# Patient Record
Sex: Female | Born: 1965 | Race: White | Hispanic: No | Marital: Married | Smoking: Never smoker
Health system: Southern US, Community
[De-identification: ages and names within clinical notes are randomized; demographics above are authoritative.]

## PROBLEM LIST (undated history)

## (undated) DIAGNOSIS — K589 Irritable bowel syndrome without diarrhea: Secondary | ICD-10-CM

## (undated) DIAGNOSIS — F329 Major depressive disorder, single episode, unspecified: Secondary | ICD-10-CM

## (undated) DIAGNOSIS — D649 Anemia, unspecified: Secondary | ICD-10-CM

## (undated) DIAGNOSIS — F32A Depression, unspecified: Secondary | ICD-10-CM

## (undated) DIAGNOSIS — Z8719 Personal history of other diseases of the digestive system: Secondary | ICD-10-CM

## (undated) DIAGNOSIS — E785 Hyperlipidemia, unspecified: Secondary | ICD-10-CM

## (undated) DIAGNOSIS — F419 Anxiety disorder, unspecified: Secondary | ICD-10-CM

## (undated) DIAGNOSIS — K219 Gastro-esophageal reflux disease without esophagitis: Secondary | ICD-10-CM

## (undated) DIAGNOSIS — M1990 Unspecified osteoarthritis, unspecified site: Secondary | ICD-10-CM

## (undated) HISTORY — DX: Major depressive disorder, single episode, unspecified: F32.9

## (undated) HISTORY — DX: Anemia, unspecified: D64.9

## (undated) HISTORY — PX: ABDOMINAL HYSTERECTOMY: SHX81

## (undated) HISTORY — PX: CHOLECYSTECTOMY: SHX55

## (undated) HISTORY — DX: Hyperlipidemia, unspecified: E78.5

## (undated) HISTORY — DX: Unspecified osteoarthritis, unspecified site: M19.90

## (undated) HISTORY — DX: Gastro-esophageal reflux disease without esophagitis: K21.9

## (undated) HISTORY — PX: OTHER SURGICAL HISTORY: SHX169

## (undated) HISTORY — DX: Personal history of other diseases of the digestive system: Z87.19

## (undated) HISTORY — DX: Anxiety disorder, unspecified: F41.9

## (undated) HISTORY — DX: Depression, unspecified: F32.A

## (undated) HISTORY — DX: Irritable bowel syndrome, unspecified: K58.9

## (undated) HISTORY — PX: TUBAL LIGATION: SHX77

---

## 1998-08-28 ENCOUNTER — Other Ambulatory Visit: Admission: RE | Admit: 1998-08-28 | Discharge: 1998-08-28 | Payer: Self-pay | Admitting: *Deleted

## 1999-09-03 ENCOUNTER — Other Ambulatory Visit: Admission: RE | Admit: 1999-09-03 | Discharge: 1999-09-03 | Payer: Self-pay | Admitting: *Deleted

## 2000-08-25 ENCOUNTER — Other Ambulatory Visit: Admission: RE | Admit: 2000-08-25 | Discharge: 2000-08-25 | Payer: Self-pay | Admitting: *Deleted

## 2001-10-06 ENCOUNTER — Other Ambulatory Visit: Admission: RE | Admit: 2001-10-06 | Discharge: 2001-10-06 | Payer: Self-pay | Admitting: *Deleted

## 2002-10-26 ENCOUNTER — Other Ambulatory Visit: Admission: RE | Admit: 2002-10-26 | Discharge: 2002-10-26 | Payer: Self-pay | Admitting: *Deleted

## 2003-01-03 ENCOUNTER — Encounter: Admission: RE | Admit: 2003-01-03 | Discharge: 2003-01-03 | Payer: Self-pay | Admitting: Family Medicine

## 2003-01-03 ENCOUNTER — Encounter: Payer: Self-pay | Admitting: Family Medicine

## 2003-12-02 ENCOUNTER — Observation Stay (HOSPITAL_COMMUNITY): Admission: RE | Admit: 2003-12-02 | Discharge: 2003-12-04 | Payer: Self-pay | Admitting: *Deleted

## 2005-02-21 ENCOUNTER — Ambulatory Visit: Payer: Self-pay | Admitting: Family Medicine

## 2006-01-27 ENCOUNTER — Ambulatory Visit: Payer: Self-pay | Admitting: Family Medicine

## 2014-12-16 HISTORY — PX: GALLBLADDER SURGERY: SHX652

## 2015-06-12 HISTORY — PX: ESOPHAGOGASTRODUODENOSCOPY: SHX1529

## 2017-03-14 DIAGNOSIS — D447 Neoplasm of uncertain behavior of aortic body and other paraganglia: Secondary | ICD-10-CM | POA: Diagnosis not present

## 2017-03-14 DIAGNOSIS — H9201 Otalgia, right ear: Secondary | ICD-10-CM | POA: Diagnosis not present

## 2017-03-27 DIAGNOSIS — D447 Neoplasm of uncertain behavior of aortic body and other paraganglia: Secondary | ICD-10-CM | POA: Diagnosis not present

## 2017-03-27 DIAGNOSIS — D4989 Neoplasm of unspecified behavior of other specified sites: Secondary | ICD-10-CM | POA: Diagnosis not present

## 2017-03-27 DIAGNOSIS — H938X1 Other specified disorders of right ear: Secondary | ICD-10-CM | POA: Diagnosis not present

## 2017-04-09 DIAGNOSIS — F3289 Other specified depressive episodes: Secondary | ICD-10-CM | POA: Diagnosis not present

## 2017-04-16 DIAGNOSIS — M79662 Pain in left lower leg: Secondary | ICD-10-CM | POA: Diagnosis not present

## 2017-04-16 DIAGNOSIS — R2242 Localized swelling, mass and lump, left lower limb: Secondary | ICD-10-CM | POA: Diagnosis not present

## 2017-05-06 DIAGNOSIS — H903 Sensorineural hearing loss, bilateral: Secondary | ICD-10-CM | POA: Diagnosis not present

## 2017-05-06 DIAGNOSIS — H9201 Otalgia, right ear: Secondary | ICD-10-CM | POA: Diagnosis not present

## 2017-05-06 DIAGNOSIS — E785 Hyperlipidemia, unspecified: Secondary | ICD-10-CM | POA: Diagnosis not present

## 2017-05-06 DIAGNOSIS — Z880 Allergy status to penicillin: Secondary | ICD-10-CM | POA: Diagnosis not present

## 2017-05-06 DIAGNOSIS — D447 Neoplasm of uncertain behavior of aortic body and other paraganglia: Secondary | ICD-10-CM | POA: Diagnosis not present

## 2017-05-06 DIAGNOSIS — H9311 Tinnitus, right ear: Secondary | ICD-10-CM | POA: Diagnosis not present

## 2017-05-06 DIAGNOSIS — Z882 Allergy status to sulfonamides status: Secondary | ICD-10-CM | POA: Diagnosis not present

## 2017-05-21 DIAGNOSIS — F3289 Other specified depressive episodes: Secondary | ICD-10-CM | POA: Diagnosis not present

## 2017-05-23 DIAGNOSIS — Z1231 Encounter for screening mammogram for malignant neoplasm of breast: Secondary | ICD-10-CM | POA: Diagnosis not present

## 2017-06-25 DIAGNOSIS — F3289 Other specified depressive episodes: Secondary | ICD-10-CM | POA: Diagnosis not present

## 2017-07-08 DIAGNOSIS — L720 Epidermal cyst: Secondary | ICD-10-CM | POA: Diagnosis not present

## 2017-07-08 DIAGNOSIS — D225 Melanocytic nevi of trunk: Secondary | ICD-10-CM | POA: Diagnosis not present

## 2017-07-08 DIAGNOSIS — D485 Neoplasm of uncertain behavior of skin: Secondary | ICD-10-CM | POA: Diagnosis not present

## 2017-07-16 HISTORY — PX: INNER EAR SURGERY: SHX679

## 2017-07-23 DIAGNOSIS — Z882 Allergy status to sulfonamides status: Secondary | ICD-10-CM | POA: Diagnosis not present

## 2017-07-23 DIAGNOSIS — H903 Sensorineural hearing loss, bilateral: Secondary | ICD-10-CM | POA: Diagnosis not present

## 2017-07-23 DIAGNOSIS — G4700 Insomnia, unspecified: Secondary | ICD-10-CM | POA: Diagnosis not present

## 2017-07-23 DIAGNOSIS — H93A1 Pulsatile tinnitus, right ear: Secondary | ICD-10-CM | POA: Diagnosis not present

## 2017-07-23 DIAGNOSIS — D385 Neoplasm of uncertain behavior of other respiratory organs: Secondary | ICD-10-CM | POA: Diagnosis not present

## 2017-07-23 DIAGNOSIS — M1990 Unspecified osteoarthritis, unspecified site: Secondary | ICD-10-CM | POA: Diagnosis not present

## 2017-07-23 DIAGNOSIS — D447 Neoplasm of uncertain behavior of aortic body and other paraganglia: Secondary | ICD-10-CM | POA: Diagnosis not present

## 2017-07-23 DIAGNOSIS — Z79899 Other long term (current) drug therapy: Secondary | ICD-10-CM | POA: Diagnosis not present

## 2017-07-23 DIAGNOSIS — D1809 Hemangioma of other sites: Secondary | ICD-10-CM | POA: Diagnosis not present

## 2017-07-23 DIAGNOSIS — E785 Hyperlipidemia, unspecified: Secondary | ICD-10-CM | POA: Diagnosis not present

## 2017-07-23 DIAGNOSIS — Z880 Allergy status to penicillin: Secondary | ICD-10-CM | POA: Diagnosis not present

## 2017-07-23 DIAGNOSIS — Z7982 Long term (current) use of aspirin: Secondary | ICD-10-CM | POA: Diagnosis not present

## 2017-07-23 DIAGNOSIS — Z9189 Other specified personal risk factors, not elsewhere classified: Secondary | ICD-10-CM | POA: Diagnosis not present

## 2017-07-23 DIAGNOSIS — H61391 Other acquired stenosis of right external ear canal: Secondary | ICD-10-CM | POA: Diagnosis not present

## 2017-07-23 DIAGNOSIS — H61301 Acquired stenosis of right external ear canal, unspecified: Secondary | ICD-10-CM | POA: Diagnosis not present

## 2017-07-23 DIAGNOSIS — E559 Vitamin D deficiency, unspecified: Secondary | ICD-10-CM | POA: Diagnosis not present

## 2017-08-03 DIAGNOSIS — H5704 Mydriasis: Secondary | ICD-10-CM | POA: Diagnosis not present

## 2017-08-03 DIAGNOSIS — R531 Weakness: Secondary | ICD-10-CM | POA: Diagnosis not present

## 2017-08-03 DIAGNOSIS — S0990XA Unspecified injury of head, initial encounter: Secondary | ICD-10-CM | POA: Diagnosis not present

## 2017-08-03 DIAGNOSIS — G902 Horner's syndrome: Secondary | ICD-10-CM | POA: Diagnosis not present

## 2017-09-23 DIAGNOSIS — R6889 Other general symptoms and signs: Secondary | ICD-10-CM | POA: Diagnosis not present

## 2017-09-23 DIAGNOSIS — R509 Fever, unspecified: Secondary | ICD-10-CM | POA: Diagnosis not present

## 2017-10-21 DIAGNOSIS — Z23 Encounter for immunization: Secondary | ICD-10-CM | POA: Diagnosis not present

## 2017-10-28 DIAGNOSIS — Z9889 Other specified postprocedural states: Secondary | ICD-10-CM | POA: Diagnosis not present

## 2017-10-28 DIAGNOSIS — D1809 Hemangioma of other sites: Secondary | ICD-10-CM | POA: Diagnosis not present

## 2017-10-28 DIAGNOSIS — H903 Sensorineural hearing loss, bilateral: Secondary | ICD-10-CM | POA: Diagnosis not present

## 2018-02-04 DIAGNOSIS — H43812 Vitreous degeneration, left eye: Secondary | ICD-10-CM | POA: Diagnosis not present

## 2018-03-23 DIAGNOSIS — B029 Zoster without complications: Secondary | ICD-10-CM | POA: Diagnosis not present

## 2018-04-10 DIAGNOSIS — R7303 Prediabetes: Secondary | ICD-10-CM | POA: Diagnosis not present

## 2018-04-10 DIAGNOSIS — E782 Mixed hyperlipidemia: Secondary | ICD-10-CM | POA: Diagnosis not present

## 2018-04-10 DIAGNOSIS — F419 Anxiety disorder, unspecified: Secondary | ICD-10-CM | POA: Diagnosis not present

## 2018-04-10 DIAGNOSIS — G4700 Insomnia, unspecified: Secondary | ICD-10-CM | POA: Diagnosis not present

## 2018-05-01 ENCOUNTER — Encounter: Payer: Self-pay | Admitting: Gastroenterology

## 2018-05-18 DIAGNOSIS — J029 Acute pharyngitis, unspecified: Secondary | ICD-10-CM | POA: Diagnosis not present

## 2018-06-17 DIAGNOSIS — Z1231 Encounter for screening mammogram for malignant neoplasm of breast: Secondary | ICD-10-CM | POA: Diagnosis not present

## 2018-06-26 DIAGNOSIS — S6991XA Unspecified injury of right wrist, hand and finger(s), initial encounter: Secondary | ICD-10-CM | POA: Diagnosis not present

## 2018-07-03 DIAGNOSIS — S6991XA Unspecified injury of right wrist, hand and finger(s), initial encounter: Secondary | ICD-10-CM | POA: Diagnosis not present

## 2018-07-06 DIAGNOSIS — S6991XA Unspecified injury of right wrist, hand and finger(s), initial encounter: Secondary | ICD-10-CM | POA: Diagnosis not present

## 2018-07-06 DIAGNOSIS — S62344A Nondisplaced fracture of base of fourth metacarpal bone, right hand, initial encounter for closed fracture: Secondary | ICD-10-CM | POA: Diagnosis not present

## 2018-07-08 DIAGNOSIS — D485 Neoplasm of uncertain behavior of skin: Secondary | ICD-10-CM | POA: Diagnosis not present

## 2018-07-08 DIAGNOSIS — D235 Other benign neoplasm of skin of trunk: Secondary | ICD-10-CM | POA: Diagnosis not present

## 2018-07-08 DIAGNOSIS — D225 Melanocytic nevi of trunk: Secondary | ICD-10-CM | POA: Diagnosis not present

## 2018-07-08 DIAGNOSIS — D1801 Hemangioma of skin and subcutaneous tissue: Secondary | ICD-10-CM | POA: Diagnosis not present

## 2018-07-13 DIAGNOSIS — S62344A Nondisplaced fracture of base of fourth metacarpal bone, right hand, initial encounter for closed fracture: Secondary | ICD-10-CM | POA: Diagnosis not present

## 2018-08-10 DIAGNOSIS — S62344A Nondisplaced fracture of base of fourth metacarpal bone, right hand, initial encounter for closed fracture: Secondary | ICD-10-CM | POA: Diagnosis not present

## 2018-09-07 DIAGNOSIS — S62344A Nondisplaced fracture of base of fourth metacarpal bone, right hand, initial encounter for closed fracture: Secondary | ICD-10-CM | POA: Diagnosis not present

## 2018-10-02 DIAGNOSIS — Z23 Encounter for immunization: Secondary | ICD-10-CM | POA: Diagnosis not present

## 2018-10-20 DIAGNOSIS — G43909 Migraine, unspecified, not intractable, without status migrainosus: Secondary | ICD-10-CM | POA: Diagnosis not present

## 2018-10-20 DIAGNOSIS — G4700 Insomnia, unspecified: Secondary | ICD-10-CM | POA: Diagnosis not present

## 2018-10-20 DIAGNOSIS — K449 Diaphragmatic hernia without obstruction or gangrene: Secondary | ICD-10-CM | POA: Diagnosis not present

## 2018-10-27 DIAGNOSIS — Z79899 Other long term (current) drug therapy: Secondary | ICD-10-CM | POA: Diagnosis not present

## 2018-10-27 DIAGNOSIS — Z0000 Encounter for general adult medical examination without abnormal findings: Secondary | ICD-10-CM | POA: Diagnosis not present

## 2018-10-29 DIAGNOSIS — Z9889 Other specified postprocedural states: Secondary | ICD-10-CM | POA: Diagnosis not present

## 2018-10-29 DIAGNOSIS — H903 Sensorineural hearing loss, bilateral: Secondary | ICD-10-CM | POA: Diagnosis not present

## 2018-10-29 DIAGNOSIS — H9311 Tinnitus, right ear: Secondary | ICD-10-CM | POA: Diagnosis not present

## 2018-10-30 DIAGNOSIS — Z6827 Body mass index (BMI) 27.0-27.9, adult: Secondary | ICD-10-CM | POA: Diagnosis not present

## 2018-10-30 DIAGNOSIS — Z0000 Encounter for general adult medical examination without abnormal findings: Secondary | ICD-10-CM | POA: Diagnosis not present

## 2018-10-30 DIAGNOSIS — Z1231 Encounter for screening mammogram for malignant neoplasm of breast: Secondary | ICD-10-CM | POA: Diagnosis not present

## 2018-10-30 DIAGNOSIS — Z1331 Encounter for screening for depression: Secondary | ICD-10-CM | POA: Diagnosis not present

## 2019-01-12 ENCOUNTER — Encounter: Payer: Self-pay | Admitting: Gastroenterology

## 2019-01-13 ENCOUNTER — Ambulatory Visit (INDEPENDENT_AMBULATORY_CARE_PROVIDER_SITE_OTHER): Payer: BLUE CROSS/BLUE SHIELD | Admitting: Gastroenterology

## 2019-01-13 ENCOUNTER — Encounter: Payer: Self-pay | Admitting: Gastroenterology

## 2019-01-13 VITALS — BP 110/76 | HR 87 | Ht 64.0 in | Wt 170.1 lb

## 2019-01-13 DIAGNOSIS — K2100 Gastro-esophageal reflux disease with esophagitis, without bleeding: Secondary | ICD-10-CM

## 2019-01-13 DIAGNOSIS — Z1211 Encounter for screening for malignant neoplasm of colon: Secondary | ICD-10-CM

## 2019-01-13 DIAGNOSIS — Z1212 Encounter for screening for malignant neoplasm of rectum: Secondary | ICD-10-CM | POA: Diagnosis not present

## 2019-01-13 MED ORDER — PANTOPRAZOLE SODIUM 20 MG PO TBEC: 20.0000 mg | DELAYED_RELEASE_TABLET | Freq: Every day | ORAL | 11 refills | Status: DC

## 2019-01-13 MED ORDER — SOD PICOSULFATE-MAG OX-CIT ACD 10-3.5-12 MG-GM -GM/160ML PO SOLN: 1.0000 | Freq: Once | ORAL | 0 refills | Status: AC

## 2019-01-13 MED ORDER — LORAZEPAM 0.5 MG PO TABS: ORAL_TABLET | ORAL | 0 refills | Status: AC

## 2019-01-13 NOTE — Progress Notes (Signed)
Chief Complaint: GERD/colorectal cancer screening.  Referring Provider:  Ernestene Kiel, MD      ASSESSMENT AND PLAN;   #1. GERD with erosive esophagitis, small HH #2. Colorectal cancer screening #3. Family history of esophageal adenocarcinoma (dad)  Plan: -Protonix 20mg  po qd. -Proceed with EGD and colonoscopy.  I have discussed the risks & benefits.  The risks including risk of perforation requiring laparotomy, bleeding after biopsies/polypectomy requiring blood transfusion and risks of anesthesia/sedation were discussed.  Rare risks of missing UGI and colorectal neoplasms were also discussed.  Consent forms given for review. -Ativan 0.5mg  po 2 hr before with sip of water. (2 tabs given) as she has problems with IV (has anxiety and vasovagal syncope)    HPI:    Phyllis Rojas is a 53 y.o. female  Having problems with heartburn, regurgitation.  Occasional dysphagia but not significant.  Has been taking Tums.  Has been on omeprazole previously but stopped since she had "brittle nails" Denies having any melena or hematochezia. Referred for screening colonoscopy.  Would like to get EGD performed as well due to longstanding gastroesophageal reflux. Alt diarrhea and constipation - since GB Sx-has more intermittent diarrhea. Attributed to IBS in the past. No weight loss No family history of colon cancer or colonic polyps.   Past Medical History:  Diagnosis Date  . Anxiety   . Dyslipidemia   . GERD (gastroesophageal reflux disease)   . Hx of pancreatitis   . IBS (irritable bowel syndrome)     Past Surgical History:  Procedure Laterality Date  . ABDOMINAL HYSTERECTOMY    . CESAREAN SECTION    . ESOPHAGOGASTRODUODENOSCOPY  06/12/2015   Irregular Z line suggestive GERD. Minimal hiatal hernia.   [facility] GALLBLADDER SURGERY  2016  . INNER EAR SURGERY  07/2017  . left knee surgery    . TUBAL LIGATION      Family History  Problem Relation Age of Onset  . Esophageal cancer  Father   . Stomach cancer Father   . Colon cancer Neg Hx   . Breast cancer Neg Hx     Social History   Tobacco Use  . Smoking status: Never Smoker  . Smokeless tobacco: Never Used  Substance Use Topics  . Alcohol use: Yes  . Drug use: Not Currently    Types: Marijuana    Current Outpatient Medications  Medication Sig Dispense Refill  . Multiple Vitamins-Minerals (EMERGEN-C IMMUNE PO) Take by mouth daily. 1 packet a day    . Omega-3 Fatty Acids (FISH OIL OMEGA-3 PO) Take 1 tablet by mouth daily.    . Red Yeast Rice Extract (RED YEAST RICE PO) Take 1 tablet by mouth daily.    . traZODone (DESYREL) 50 MG tablet 1 tablet daily.     No current facility-administered medications for this visit.     Allergies  Allergen Reactions  . Sulfa Antibiotics   . Penicillin G Rash  . Sulfamethoxazole Rash    Review of Systems:  Constitutional: Denies fever, chills, diaphoresis, appetite change and fatigue.  HEENT: Denies photophobia, eye pain, redness, hearing loss, ear pain, congestion, sore throat, rhinorrhea, sneezing, mouth sores, neck pain, neck stiffness and tinnitus.   Respiratory: Denies SOB, DOE, cough, chest tightness,  and wheezing.   Cardiovascular: Denies chest pain, palpitations and leg swelling.  Genitourinary: Denies dysuria, urgency, frequency, hematuria, flank pain and difficulty urinating.  Musculoskeletal: Denies myalgias, back pain, joint swelling, arthralgias and gait problem.  Skin: No rash.  Neurological: Denies  dizziness, seizures, syncope, weakness, light-headedness, numbness and headaches.  Hematological: Denies adenopathy. Easy bruising, personal or family bleeding history  Psychiatric/Behavioral: Has anxiety or depression     Physical Exam:    BP 110/76   Pulse 87   Ht 5\' 4"  (1.626 m)   Wt 170 lb 2 oz (77.2 kg)   BMI 29.20 kg/m  Filed Weights   01/13/19 0904  Weight: 170 lb 2 oz (77.2 kg)   Constitutional:  Well-developed, in no acute  distress. Psychiatric: Normal mood and affect. Behavior is normal. HEENT: Pupils normal.  Conjunctivae are normal. No scleral icterus.  Cardiovascular: Normal rate, regular rhythm. No edema Pulmonary/chest: Effort normal and breath sounds normal. No wheezing, rales or rhonchi. Abdominal: Soft, nondistended. Nontender. Bowel sounds active throughout. There are no masses palpable. No hepatomegaly. Rectal:  defered Neurological: Alert and oriented to person place and time. Skin: Skin is warm and dry. No rashes noted.    Carmell Austria, MD 01/13/2019, 9:15 AM  Cc: Ernestene Kiel, MD

## 2019-01-13 NOTE — Patient Instructions (Addendum)
If you are age 52 or older, your body mass index should be between 23-30. Your Body mass index is 29.2 kg/m. If this is out of the aforementioned range listed, please consider follow up with your Primary Care Provider.  If you are age 52 or younger, your body mass index should be between 19-25. Your Body mass index is 29.2 kg/m. If this is out of the aformentioned range listed, please consider follow up with your Primary Care Provider.   We have sent the following medications to your pharmacy for you to pick up at your convenience: [place] have been scheduled for an endoscopy and colonoscopy. Please follow the written instructions given to you at your visit today. Please pick up your prep supplies at the pharmacy within the next 1-3 days. If you use inhalers (even only as needed), please bring them with you on the day of your procedure. Your physician has requested that you go to www.startemmi.com and enter the access code given to you at your visit today. This web site gives a general overview about your procedure. However, you should still follow specific instructions given to you by our office regarding your preparation for the procedure.   It has been recommended to you by your physician that you have a(n) EGD/Colonoscopy completed. Per your request, we did not schedule the procedure(s) today. Please contact our office at [phone]  to have the procedure completed.   Thank you,  Dr. Jackquline Denmark

## 2019-01-20 ENCOUNTER — Encounter: Payer: Self-pay | Admitting: Gastroenterology

## 2019-01-29 DIAGNOSIS — R748 Abnormal levels of other serum enzymes: Secondary | ICD-10-CM | POA: Diagnosis not present

## 2019-01-29 DIAGNOSIS — E782 Mixed hyperlipidemia: Secondary | ICD-10-CM | POA: Diagnosis not present

## 2019-02-12 ENCOUNTER — Ambulatory Visit (AMBULATORY_SURGERY_CENTER): Payer: Self-pay

## 2019-02-12 ENCOUNTER — Encounter: Payer: Self-pay | Admitting: Gastroenterology

## 2019-02-12 VITALS — Ht 64.0 in | Wt 168.2 lb

## 2019-02-12 DIAGNOSIS — K2100 Gastro-esophageal reflux disease with esophagitis, without bleeding: Secondary | ICD-10-CM

## 2019-02-12 DIAGNOSIS — Z1211 Encounter for screening for malignant neoplasm of colon: Secondary | ICD-10-CM

## 2019-02-12 MED ORDER — NA SULFATE-K SULFATE-MG SULF 17.5-3.13-1.6 GM/177ML PO SOLN: 1.0000 | Freq: Once | ORAL | 0 refills | Status: AC

## 2019-02-12 NOTE — Progress Notes (Signed)
Denies allergies to eggs or soy products. Denies complication of anesthesia or sedation. Denies use of weight loss medication. Denies use of O2.   Emmi instructions declined.   A pay no more than 50.00 coupon for Suprep was given to the patient.  

## 2019-02-26 ENCOUNTER — Other Ambulatory Visit: Payer: Self-pay

## 2019-02-26 ENCOUNTER — Ambulatory Visit (AMBULATORY_SURGERY_CENTER): Payer: BLUE CROSS/BLUE SHIELD | Admitting: Gastroenterology

## 2019-02-26 ENCOUNTER — Encounter: Payer: Self-pay | Admitting: Gastroenterology

## 2019-02-26 VITALS — BP 131/77 | HR 59 | Temp 99.3°F | Resp 11 | Ht 64.0 in | Wt 170.0 lb

## 2019-02-26 DIAGNOSIS — K589 Irritable bowel syndrome without diarrhea: Secondary | ICD-10-CM

## 2019-02-26 DIAGNOSIS — D127 Benign neoplasm of rectosigmoid junction: Secondary | ICD-10-CM | POA: Diagnosis not present

## 2019-02-26 DIAGNOSIS — K317 Polyp of stomach and duodenum: Secondary | ICD-10-CM | POA: Diagnosis not present

## 2019-02-26 DIAGNOSIS — D128 Benign neoplasm of rectum: Secondary | ICD-10-CM

## 2019-02-26 DIAGNOSIS — K2100 Gastro-esophageal reflux disease with esophagitis, without bleeding: Secondary | ICD-10-CM

## 2019-02-26 DIAGNOSIS — K621 Rectal polyp: Secondary | ICD-10-CM | POA: Diagnosis not present

## 2019-02-26 DIAGNOSIS — D120 Benign neoplasm of cecum: Secondary | ICD-10-CM | POA: Diagnosis not present

## 2019-02-26 DIAGNOSIS — K449 Diaphragmatic hernia without obstruction or gangrene: Secondary | ICD-10-CM

## 2019-02-26 DIAGNOSIS — K2970 Gastritis, unspecified, without bleeding: Secondary | ICD-10-CM | POA: Diagnosis not present

## 2019-02-26 DIAGNOSIS — K229 Disease of esophagus, unspecified: Secondary | ICD-10-CM

## 2019-02-26 DIAGNOSIS — K208 Other esophagitis: Secondary | ICD-10-CM | POA: Diagnosis not present

## 2019-02-26 DIAGNOSIS — K219 Gastro-esophageal reflux disease without esophagitis: Secondary | ICD-10-CM | POA: Diagnosis not present

## 2019-02-26 DIAGNOSIS — Z1211 Encounter for screening for malignant neoplasm of colon: Secondary | ICD-10-CM

## 2019-02-26 DIAGNOSIS — D125 Benign neoplasm of sigmoid colon: Secondary | ICD-10-CM

## 2019-02-26 MED ORDER — SODIUM CHLORIDE 0.9 % IV SOLN: 500.0000 mL | Freq: Once | INTRAVENOUS | Status: DC

## 2019-02-26 NOTE — Op Note (Signed)
[name] Procedure Date: 02/26/2019 8:06 AM MRN: [id number] Endoscopist: Jackquline Denmark , MD Age: 53 Referring MD:  Date of Birth: 1966-02-11 Gender: Female Account #: [id number] Procedure:                Colonoscopy Indications:              Screening for colorectal malignant neoplasm,                            intermittent diarrhea attributed to IBS. Medicines:                Monitored Anesthesia Care Procedure:                Pre-Anesthesia Assessment:                           - Prior to the procedure, a History and Physical                            was performed, and patient medications and                            allergies were reviewed. The patient's tolerance of                            previous anesthesia was also reviewed. The risks                            and benefits of the procedure and the sedation                            options and risks were discussed with the patient.                            All questions were answered, and informed consent                            was obtained. Prior Anticoagulants: The patient has                            taken no previous anticoagulant or antiplatelet                            agents. ASA Grade Assessment: II - A patient with                            mild systemic disease. After reviewing the risks                            and benefits, the patient was deemed in                            satisfactory condition to undergo the procedure.  After obtaining informed consent, the colonoscope                            was passed under direct vision. Throughout the                            procedure, the patient's blood pressure, pulse, and                            oxygen saturations were monitored continuously. The                            Colonoscope was introduced through the anus and                            advanced to the 2 cm into  the ileum. The                            colonoscopy was performed without difficulty. The                            patient tolerated the procedure well. The quality                            of the bowel preparation was excellent. The                            terminal ileum, ileocecal valve, appendiceal                            orifice, and rectum were photographed. Scope In: 8:22:08 AM Scope Out: 8:34:39 AM Scope Withdrawal Time: 0 hours 9 minutes 13 seconds  Total Procedure Duration: 0 hours 12 minutes 31 seconds  Findings:                 A 8 mm polyp was found in the cecum. The polyp was                            sessile. The polyp was removed with a cold snare.                            Resection and retrieval were complete. Estimated                            blood loss: none.                           Two sessile polyps were found in the rectum and                            distal sigmoid colon. The polyps were 2 to 4 mm in                            size.  These polyps were removed with a cold snare.                            Resection and retrieval were complete. Estimated                            blood loss: none.                           The terminal ileum appeared normal.                           Biopsies for histology were taken with a cold                            forceps from the entire colon for evaluation of                            microscopic colitis. Estimated blood loss: none.                           The exam was otherwise without abnormality on                            direct and retroflexion views. Complications:            No immediate complications. Estimated Blood Loss:     Estimated blood loss: none. Impression:               -Colonic polyps status post polypectomy.                           -Otherwise normal colonoscopy to TI. Recommendation:           - Patient has a contact number available for                             emergencies. The signs and symptoms of potential                            delayed complications were discussed with the                            patient. Return to normal activities tomorrow.                            Written discharge instructions were provided to the                            patient.                           - Resume previous diet.                           - Continue present medications.                           -  Await pathology results.                           - Repeat colonoscopy for surveillance based on                            pathology results.                           - Return to GI clinic PRN. Jackquline Denmark, MD 02/26/2019 8:45:40 AM This report has been signed electronically.

## 2019-02-26 NOTE — Patient Instructions (Signed)
No aspirin, adivil, ibuprofen, and other NSAIDS for 5 days- can start again next Wednesday if needed  YOU HAD AN ENDOSCOPIC PROCEDURE TODAY AT [facility]:   Refer to the procedure report that was given to you for any specific questions about what was found during the examination.  If the procedure report does not answer your questions, please call your gastroenterologist to clarify.  If you requested that your care partner not be given the details of your procedure findings, then the procedure report has been included in a sealed envelope for you to review at your convenience later.  YOU SHOULD EXPECT: Some feelings of bloating in the abdomen. Passage of more gas than usual.  Walking can help get rid of the air that was put into your GI tract during the procedure and reduce the bloating. If you had a lower endoscopy (such as a colonoscopy or flexible sigmoidoscopy) you may notice spotting of blood in your stool or on the toilet paper. If you underwent a bowel prep for your procedure, you may not have a normal bowel movement for a few days.  Please Note:  You might notice some irritation and congestion in your nose or some drainage.  This is from the oxygen used during your procedure.  There is no need for concern and it should clear up in a day or so.  SYMPTOMS TO REPORT IMMEDIATELY:   Following lower endoscopy (colonoscopy or flexible sigmoidoscopy):  Excessive amounts of blood in the stool  Significant tenderness or worsening of abdominal pains  Swelling of the abdomen that is new, acute  Fever of 100F or higher   Following upper endoscopy (EGD)  Vomiting of blood or coffee ground material  New chest pain or pain under the shoulder blades  Painful or persistently difficult swallowing  New shortness of breath  Fever of 100F or higher  Black, tarry-looking stools  For urgent or emergent issues, a gastroenterologist can be reached at any hour by calling [phone]  [phone].   DIET:  We do recommend a small meal at first, but then you may proceed to your regular diet.  Drink plenty of fluids but you should avoid alcoholic beverages for 24 hours.  ACTIVITY:  You should plan to take it easy for the rest of today and you should NOT DRIVE or use heavy machinery until tomorrow (because of the sedation medicines used during the test).    FOLLOW UP: Our staff will call the number listed on your records the next business day following your procedure to check on you and address any questions or concerns that you may have regarding the information given to you following your procedure. If we do not reach you, we will leave a message.  However, if you are feeling well and you are not experiencing any problems, there is no need to return our call.  We will assume that you have returned to your regular daily activities without incident.  If any biopsies were taken you will be contacted by phone or by letter within the next 1-3 weeks.  Please call us at [phone] if you have not heard about the biopsies in 3 weeks.    SIGNATURES/CONFIDENTIALITY: You and/or your care partner have signed paperwork which will be entered into your electronic medical record.  These signatures attest to the fact that that the information above on your After Visit Summary has been reviewed and is understood.  Full responsibility of the confidentiality of this discharge information  lies with you and/or your care-partner.

## 2019-02-26 NOTE — Progress Notes (Signed)
Report given to PACU, vss 

## 2019-02-26 NOTE — Progress Notes (Signed)
Called to room to assist during endoscopic procedure.  Patient ID and intended procedure confirmed with present staff. Received instructions for my participation in the procedure from the performing physician.  

## 2019-02-26 NOTE — Progress Notes (Signed)
Pt's states no medical or surgical changes since previsit or office visit. 

## 2019-02-26 NOTE — Op Note (Signed)
[facility] Patient Name: Phyllis Rojas Procedure Date: 02/26/2019 8:06 AM MRN: [id number] Endoscopist: Jackquline Denmark , MD Age: 53 Referring MD:  Date of Birth: December 09, 1966 Gender: Female Account #: [id number] Procedure:                Upper GI endoscopy Indications:              Epigastric abdominal pain, family history of                            esophageal cancer. Medicines:                Monitored Anesthesia Care Procedure:                Pre-Anesthesia Assessment:                           - Prior to the procedure, a History and Physical                            was performed, and patient medications and                            allergies were reviewed. The patient's tolerance of                            previous anesthesia was also reviewed. The risks                            and benefits of the procedure and the sedation                            options and risks were discussed with the patient.                            All questions were answered, and informed consent                            was obtained. Prior Anticoagulants: The patient has                            taken no previous anticoagulant or antiplatelet                            agents. ASA Grade Assessment: II - A patient with                            mild systemic disease. After reviewing the risks                            and benefits, the patient was deemed in                            satisfactory condition to undergo the procedure.  After obtaining informed consent, the endoscope was                            passed under direct vision. Throughout the                            procedure, the patient's blood pressure, pulse, and                            oxygen saturations were monitored continuously. The                            Endoscope was introduced through the mouth, and                            advanced to the second part of duodenum. The  upper                            GI endoscopy was accomplished without difficulty.                            The patient tolerated the procedure well. Scope In: Scope Out: Findings:                 The Z-line was irregular and was found 35 cm from                            the incisors. Biopsies were taken with a cold                            forceps for histology, directed by narrowband                            imaging. Estimated blood loss: none.                           A small transient hiatal hernia was present.                           A single 8 mm semi-sessile polyp with no bleeding                            and no stigmata of recent bleeding was found in the                            gastric fundus. The polyp was removed with a hot                            snare. Resection and retrieval were complete.                            Estimated blood loss: none.  The examined duodenum was normal. Biopsies for                            histology were taken with a cold forceps for                            evaluation of celiac disease. Estimated blood loss:                            none. Complications:            No immediate complications. Estimated Blood Loss:     Estimated blood loss: none. Impression:               - Z-line irregular, 35 cm from the incisors.                            Biopsied.                           - Small transient hiatal hernia.                           - A single gastric polyp. Resected and retrieved.                           - Otherwise normal EGD. Recommendation:           - Patient has a contact number available for                            emergencies. The signs and symptoms of potential                            delayed complications were discussed with the                            patient. Return to normal activities tomorrow.                            Written discharge instructions were provided to the                             patient.                           - Resume previous diet.                           - Continue present medications.                           - Await pathology results.                           - No aspirin, ibuprofen, naproxen, or other  non-steroidal anti-inflammatory drugs for 5 days                            after polyp removal. Jackquline Denmark, MD 02/26/2019 8:41:16 AM This report has been signed electronically.

## 2019-03-01 ENCOUNTER — Telehealth: Payer: Self-pay

## 2019-03-01 NOTE — Telephone Encounter (Signed)
  Follow up Call-  Call back number 02/26/2019  Post procedure Call Back phone  # [phone] cell  Permission to leave phone message Yes  Some recent data might be hidden     Patient questions:  Do you have a fever, pain , or abdominal swelling? No. Pain Score  0 *  Have you tolerated food without any problems? Yes.    Have you been able to return to your normal activities? Yes.    Do you have any questions about your discharge instructions: Diet   No. Medications  No. Follow up visit  No.  Do you have questions or concerns about your Care? No.  Actions: * If pain score is 4 or above: No action needed, pain <4.

## 2019-03-04 ENCOUNTER — Encounter: Payer: Self-pay | Admitting: Gastroenterology

## 2019-07-29 DIAGNOSIS — L578 Other skin changes due to chronic exposure to nonionizing radiation: Secondary | ICD-10-CM | POA: Diagnosis not present

## 2019-07-29 DIAGNOSIS — D485 Neoplasm of uncertain behavior of skin: Secondary | ICD-10-CM | POA: Diagnosis not present

## 2019-07-29 DIAGNOSIS — D225 Melanocytic nevi of trunk: Secondary | ICD-10-CM | POA: Diagnosis not present

## 2019-07-29 DIAGNOSIS — L720 Epidermal cyst: Secondary | ICD-10-CM | POA: Diagnosis not present

## 2019-07-31 DIAGNOSIS — Z1231 Encounter for screening mammogram for malignant neoplasm of breast: Secondary | ICD-10-CM | POA: Diagnosis not present

## 2019-09-03 DIAGNOSIS — Z79899 Other long term (current) drug therapy: Secondary | ICD-10-CM | POA: Diagnosis not present

## 2019-09-03 DIAGNOSIS — E782 Mixed hyperlipidemia: Secondary | ICD-10-CM | POA: Diagnosis not present

## 2019-09-03 DIAGNOSIS — K219 Gastro-esophageal reflux disease without esophagitis: Secondary | ICD-10-CM | POA: Diagnosis not present

## 2019-09-03 DIAGNOSIS — R7303 Prediabetes: Secondary | ICD-10-CM | POA: Diagnosis not present

## 2019-09-03 DIAGNOSIS — G4700 Insomnia, unspecified: Secondary | ICD-10-CM | POA: Diagnosis not present

## 2019-11-01 DIAGNOSIS — Z1231 Encounter for screening mammogram for malignant neoplasm of breast: Secondary | ICD-10-CM | POA: Diagnosis not present

## 2019-11-01 DIAGNOSIS — R899 Unspecified abnormal finding in specimens from other organs, systems and tissues: Secondary | ICD-10-CM | POA: Diagnosis not present

## 2019-11-01 DIAGNOSIS — Z23 Encounter for immunization: Secondary | ICD-10-CM | POA: Diagnosis not present

## 2019-11-01 DIAGNOSIS — Z1331 Encounter for screening for depression: Secondary | ICD-10-CM | POA: Diagnosis not present

## 2019-11-01 DIAGNOSIS — Z0000 Encounter for general adult medical examination without abnormal findings: Secondary | ICD-10-CM | POA: Diagnosis not present

## 2019-11-16 DIAGNOSIS — D72820 Lymphocytosis (symptomatic): Secondary | ICD-10-CM | POA: Diagnosis not present

## 2019-12-02 DIAGNOSIS — D72820 Lymphocytosis (symptomatic): Secondary | ICD-10-CM | POA: Diagnosis not present

## 2019-12-27 DIAGNOSIS — R5383 Other fatigue: Secondary | ICD-10-CM | POA: Diagnosis not present

## 2019-12-27 DIAGNOSIS — D72820 Lymphocytosis (symptomatic): Secondary | ICD-10-CM | POA: Diagnosis not present

## 2019-12-27 DIAGNOSIS — Z8349 Family history of other endocrine, nutritional and metabolic diseases: Secondary | ICD-10-CM | POA: Diagnosis not present

## 2019-12-27 DIAGNOSIS — M26609 Unspecified temporomandibular joint disorder, unspecified side: Secondary | ICD-10-CM | POA: Diagnosis not present

## 2020-02-02 DIAGNOSIS — F331 Major depressive disorder, recurrent, moderate: Secondary | ICD-10-CM | POA: Diagnosis not present

## 2020-02-09 DIAGNOSIS — F331 Major depressive disorder, recurrent, moderate: Secondary | ICD-10-CM | POA: Diagnosis not present

## 2020-02-16 DIAGNOSIS — F331 Major depressive disorder, recurrent, moderate: Secondary | ICD-10-CM | POA: Diagnosis not present

## 2020-02-21 DIAGNOSIS — Z713 Dietary counseling and surveillance: Secondary | ICD-10-CM | POA: Diagnosis not present

## 2020-02-23 DIAGNOSIS — F331 Major depressive disorder, recurrent, moderate: Secondary | ICD-10-CM | POA: Diagnosis not present

## 2020-03-01 DIAGNOSIS — F331 Major depressive disorder, recurrent, moderate: Secondary | ICD-10-CM | POA: Diagnosis not present

## 2020-03-06 DIAGNOSIS — Z713 Dietary counseling and surveillance: Secondary | ICD-10-CM | POA: Diagnosis not present

## 2020-03-08 DIAGNOSIS — F331 Major depressive disorder, recurrent, moderate: Secondary | ICD-10-CM | POA: Diagnosis not present

## 2020-03-20 DIAGNOSIS — Z713 Dietary counseling and surveillance: Secondary | ICD-10-CM | POA: Diagnosis not present

## 2020-03-22 DIAGNOSIS — F331 Major depressive disorder, recurrent, moderate: Secondary | ICD-10-CM | POA: Diagnosis not present

## 2020-03-29 DIAGNOSIS — F331 Major depressive disorder, recurrent, moderate: Secondary | ICD-10-CM | POA: Diagnosis not present

## 2020-04-12 DIAGNOSIS — F331 Major depressive disorder, recurrent, moderate: Secondary | ICD-10-CM | POA: Diagnosis not present

## 2020-04-17 DIAGNOSIS — Z713 Dietary counseling and surveillance: Secondary | ICD-10-CM | POA: Diagnosis not present

## 2020-05-01 DIAGNOSIS — Z79899 Other long term (current) drug therapy: Secondary | ICD-10-CM | POA: Diagnosis not present

## 2020-05-01 DIAGNOSIS — K219 Gastro-esophageal reflux disease without esophagitis: Secondary | ICD-10-CM | POA: Diagnosis not present

## 2020-05-01 DIAGNOSIS — E782 Mixed hyperlipidemia: Secondary | ICD-10-CM | POA: Diagnosis not present

## 2020-05-01 DIAGNOSIS — R7303 Prediabetes: Secondary | ICD-10-CM | POA: Diagnosis not present

## 2020-05-01 DIAGNOSIS — D72820 Lymphocytosis (symptomatic): Secondary | ICD-10-CM | POA: Diagnosis not present

## 2020-05-05 DIAGNOSIS — Z713 Dietary counseling and surveillance: Secondary | ICD-10-CM | POA: Diagnosis not present

## 2020-05-10 DIAGNOSIS — F331 Major depressive disorder, recurrent, moderate: Secondary | ICD-10-CM | POA: Diagnosis not present

## 2020-06-05 DIAGNOSIS — F331 Major depressive disorder, recurrent, moderate: Secondary | ICD-10-CM | POA: Diagnosis not present

## 2020-07-03 DIAGNOSIS — F331 Major depressive disorder, recurrent, moderate: Secondary | ICD-10-CM | POA: Diagnosis not present

## 2020-07-17 DIAGNOSIS — F331 Major depressive disorder, recurrent, moderate: Secondary | ICD-10-CM | POA: Diagnosis not present

## 2020-07-28 DIAGNOSIS — D225 Melanocytic nevi of trunk: Secondary | ICD-10-CM | POA: Diagnosis not present

## 2020-07-28 DIAGNOSIS — L578 Other skin changes due to chronic exposure to nonionizing radiation: Secondary | ICD-10-CM | POA: Diagnosis not present

## 2020-07-28 DIAGNOSIS — D485 Neoplasm of uncertain behavior of skin: Secondary | ICD-10-CM | POA: Diagnosis not present

## 2020-09-04 DIAGNOSIS — F331 Major depressive disorder, recurrent, moderate: Secondary | ICD-10-CM | POA: Diagnosis not present

## 2020-09-18 DIAGNOSIS — F331 Major depressive disorder, recurrent, moderate: Secondary | ICD-10-CM | POA: Diagnosis not present

## 2020-10-30 DIAGNOSIS — Z79899 Other long term (current) drug therapy: Secondary | ICD-10-CM | POA: Diagnosis not present

## 2020-10-30 DIAGNOSIS — Z1231 Encounter for screening mammogram for malignant neoplasm of breast: Secondary | ICD-10-CM | POA: Diagnosis not present

## 2020-10-30 DIAGNOSIS — Z131 Encounter for screening for diabetes mellitus: Secondary | ICD-10-CM | POA: Diagnosis not present

## 2020-10-30 DIAGNOSIS — Z1331 Encounter for screening for depression: Secondary | ICD-10-CM | POA: Diagnosis not present

## 2020-10-30 DIAGNOSIS — E785 Hyperlipidemia, unspecified: Secondary | ICD-10-CM | POA: Diagnosis not present

## 2020-10-30 DIAGNOSIS — Z0000 Encounter for general adult medical examination without abnormal findings: Secondary | ICD-10-CM | POA: Diagnosis not present

## 2020-11-07 DIAGNOSIS — Z1231 Encounter for screening mammogram for malignant neoplasm of breast: Secondary | ICD-10-CM | POA: Diagnosis not present

## 2020-11-14 DIAGNOSIS — Z20822 Contact with and (suspected) exposure to covid-19: Secondary | ICD-10-CM | POA: Diagnosis not present

## 2021-01-08 ENCOUNTER — Encounter: Payer: Self-pay | Admitting: Gastroenterology

## 2021-01-08 ENCOUNTER — Other Ambulatory Visit (INDEPENDENT_AMBULATORY_CARE_PROVIDER_SITE_OTHER): Payer: BC Managed Care – PPO

## 2021-01-08 ENCOUNTER — Ambulatory Visit: Payer: BLUE CROSS/BLUE SHIELD | Admitting: Gastroenterology

## 2021-01-08 ENCOUNTER — Other Ambulatory Visit: Payer: Self-pay

## 2021-01-08 VITALS — BP 114/78 | HR 77 | Ht 64.5 in | Wt 171.1 lb

## 2021-01-08 DIAGNOSIS — R1013 Epigastric pain: Secondary | ICD-10-CM | POA: Diagnosis not present

## 2021-01-08 DIAGNOSIS — R52 Pain, unspecified: Secondary | ICD-10-CM

## 2021-01-08 MED ORDER — PANTOPRAZOLE SODIUM 20 MG PO TBEC: 20.0000 mg | DELAYED_RELEASE_TABLET | Freq: Two times a day (BID) | ORAL | 11 refills | Status: DC

## 2021-01-08 NOTE — Progress Notes (Signed)
Chief Complaint: FU  Referring Provider:  Ernestene Kiel, MD      ASSESSMENT AND PLAN;   #1. Epi pain/LUQ pain with tenderness. R/O pancreatitis. #2. GERD with H/O erosive esophagitis, small HH  Plan: -Protonix 20mg  po bid #60, 11 refills -CBC, CMP, lipase, CRP fasting lipid profile. -Continue Vascepa.  We have to add TriCor if triglycerides still high.  Also discussed best possible control for borderline diabetes. -CT AP with p.o. and IV contrast.    HPI:    Phyllis Rojas is a 55 y.o. female  With bordeline DM (HBA1c 6.2), hypertriglyceridemia (TG 955 10/2020 on Vascepa, could not tolerate statins in the past), anxiety/depression, H/O idiopathic pancreatitis in the past  With generalized abdominal pain which is more so in the epigastric and left upper quadrant area associated with nausea but no vomiting.  She also has some back pain but could not tell me if it was the same pain radiating to the back.  No jaundice dark urine or pale stools.  No fever chills or night sweats.  The abdominal pain does get worse after eating.  No significant change in bowel habits currently.  She always has looser bowel movements since cholecystectomy.  No further significant heartburn ever since she is on Protonix.  At times in the evening, she would have some regurgitation.  No odynophagia or dysphagia.  No weight loss.  No alcohol.   I have reviewed labs over her phone TG 955 10/30/2020-normal CBC, CMP   Previous GI evaluation:  EGD 02/26/2019 - Z-line irregular, 35 cm from the incisors. Bx- neg for Barrett's. - Small transient hiatal hernia. - A single gastric polyp. Resected and retrieved. Bx-fundic gland polyps - Otherwise normal EGD. - Negative small bowel biopsies for celiac  Colon 02/2019 -Colonic polyps status post polypectomy. Bx- TA, hyperplastic.  Repeat in 7 years. -Otherwise normal colonoscopy to TI.  FH of esophageal adenocarcinoma (dad) Past Medical History:   Diagnosis Date  . Anemia   . Anxiety   . Arthritis   . Depression   . Dyslipidemia   . GERD (gastroesophageal reflux disease)   . Hx of pancreatitis   . Hyperlipidemia   . IBS (irritable bowel syndrome)     Past Surgical History:  Procedure Laterality Date  . ABDOMINAL HYSTERECTOMY    . CESAREAN SECTION    . CHOLECYSTECTOMY    . ESOPHAGOGASTRODUODENOSCOPY  06/12/2015   Irregular Z line suggestive GERD. Minimal hiatal hernia.   Marland Kitchen GALLBLADDER SURGERY  2016  . INNER EAR SURGERY  07/2017  . left knee surgery    . TUBAL LIGATION      Family History  Problem Relation Age of Onset  . Esophageal cancer Father   . Stomach cancer Father   . Colon cancer Neg Hx   . Breast cancer Neg Hx   . Rectal cancer Neg Hx     Social History   Tobacco Use  . Smoking status: Never Smoker  . Smokeless tobacco: Never Used  Vaping Use  . Vaping Use: Never used  Substance Use Topics  . Alcohol use: Yes  . Drug use: Never    Current Outpatient Medications  Medication Sig Dispense Refill  . LORazepam (ATIVAN) 0.5 MG tablet Take one tablet 2 hour before procedure with a sip of water. 2 tablet 0  . OVER THE COUNTER MEDICATION Emergen- C  Take one package every morning.    . pantoprazole (PROTONIX) 20 MG tablet Take 1 tablet (20 mg  total) by mouth daily. 30 tablet 11  . VASCEPA 1 g capsule Take 2 g by mouth 2 (two) times daily.     No current facility-administered medications for this visit.    Allergies  Allergen Reactions  . Beef-Derived Products Hives    GI upset, abdominal cramping  . Sulfa Antibiotics   . Penicillin G Rash  . Sulfamethoxazole Rash    Review of Systems:  Constitutional: Denies fever, chills, diaphoresis, appetite change and fatigue.  HEENT: Denies photophobia, eye pain, redness, hearing loss, ear pain, congestion, sore throat, rhinorrhea, sneezing, mouth sores, neck pain, neck stiffness and tinnitus.   Respiratory: Denies SOB, DOE, cough, chest tightness,  and  wheezing.   Cardiovascular: Denies chest pain, palpitations and leg swelling.  Genitourinary: Denies dysuria, urgency, frequency, hematuria, flank pain and difficulty urinating.  Musculoskeletal: Denies myalgias, back pain, joint swelling, arthralgias and gait problem.  Skin: No rash.  Neurological: Denies dizziness, seizures, syncope, weakness, light-headedness, numbness and headaches.  Hematological: Denies adenopathy. Easy bruising, personal or family bleeding history  Psychiatric/Behavioral: Has anxiety or depression     Physical Exam:    BP 114/78   Pulse 77   Ht 5' 4.5" (1.638 m)   Wt 171 lb 2 oz (77.6 kg)   BMI 28.92 kg/m  Filed Weights   01/08/21 1451  Weight: 171 lb 2 oz (77.6 kg)   Constitutional:  Well-developed, in no acute distress. Psychiatric: Normal mood and affect. Behavior is normal. HEENT: Pupils normal.  Conjunctivae are normal. No scleral icterus.  Cardiovascular: Normal rate, regular rhythm. No edema Pulmonary/chest: Effort normal and breath sounds normal. No wheezing, rales or rhonchi. Abdominal: Soft, nondistended.  Epigastric and left upper quadrant abdominal tenderness.  Bowel sounds active throughout. There are no masses palpable. No hepatomegaly. Rectal:  defered Neurological: Alert and oriented to person place and time. Skin: Skin is warm and dry. No rashes noted.    Carmell Austria, MD 01/08/2021, 3:02 PM  Cc: Ernestene Kiel, MD

## 2021-01-08 NOTE — Patient Instructions (Signed)
If you are age 54 or older, your body mass index should be between 23-30. Your Body mass index is 28.92 kg/m. If this is out of the aforementioned range listed, please consider follow up with your Primary Care Provider.  If you are age 54 or younger, your body mass index should be between 19-25. Your Body mass index is 28.92 kg/m. If this is out of the aformentioned range listed, please consider follow up with your Primary Care Provider.   Please go to the lab on the 2nd floor [address] before you leave the office today.   You have been [facility] CT [facility] [place] [address] [address] [facility]   You are scheduled on --- at ---. You should arrive 15 minutes prior to your appointment time for registration. Please follow the written instructions below on the day of your exam:  WARNING: IF YOU ARE ALLERGIC TO IODINE/X-RAY DYE, PLEASE NOTIFY RADIOLOGY IMMEDIATELY AT [phone]! YOU WILL BE GIVEN A 13 HOUR PREMEDICATION PREP.  1) Do not eat or drink anything after --- (4 hours prior to your test) 2) You have been given 2 bottles of oral contrast to drink. The solution may taste better if refrigerated, but do NOT add ice or any other liquid to this solution. Shake well before drinking.    Drink 1 bottle of contrast @ --- (2 hours prior to your exam)  Drink 1 bottle of contrast @ --- (1 hour prior to your exam)  You may take any medications as prescribed with a small amount of water, if necessary. If you take any of the following medications: METFORMIN, GLUCOPHAGE, GLUCOVANCE, AVANDAMET, RIOMET, FORTAMET, [place] MET, JANUMET, GLUMETZA or METAGLIP, you MAY be asked to HOLD this medication 48 hours AFTER the exam.  The purpose of you drinking the oral contrast is to aid in the visualization of your intestinal tract. The contrast solution may cause some diarrhea. Depending on your individual set of symptoms, you may  also receive an intravenous injection of x-ray contrast/dye. Plan on being at [facility] for 30 minutes or longer, depending on the type of exam you are having performed.  This test typically takes 30-45 minutes to complete.  If you have any questions regarding your exam or if you need to reschedule, you may call the CT department at [phone] between the hours of 8:00 am and 5:00 pm, Monday-Friday.  ________________________________________________________________________  Thank you,  Dr. Jackquline Denmark

## 2021-01-08 NOTE — Addendum Note (Signed)
Addended by: Manuela Schwartz on: 01/08/2021 03:53 PM   Modules accepted: Orders

## 2021-01-09 ENCOUNTER — Telehealth: Payer: Self-pay | Admitting: Gastroenterology

## 2021-01-09 ENCOUNTER — Other Ambulatory Visit: Payer: Self-pay

## 2021-01-09 ENCOUNTER — Other Ambulatory Visit (INDEPENDENT_AMBULATORY_CARE_PROVIDER_SITE_OTHER): Payer: BC Managed Care – PPO

## 2021-01-09 DIAGNOSIS — R1013 Epigastric pain: Secondary | ICD-10-CM

## 2021-01-09 DIAGNOSIS — R52 Pain, unspecified: Secondary | ICD-10-CM | POA: Diagnosis not present

## 2021-01-09 LAB — CBC WITH DIFFERENTIAL/PLATELET
Basophils Absolute: 0.1 10*3/uL (ref 0.0–0.1)
Basophils Relative: 0.9 % (ref 0.0–3.0)
Eosinophils Absolute: 0.1 10*3/uL (ref 0.0–0.7)
Eosinophils Relative: 0.9 % (ref 0.0–5.0)
HCT: 41.7 % (ref 36.0–46.0)
Hemoglobin: 13.7 g/dL (ref 12.0–15.0)
Lymphocytes Relative: 33.6 % (ref 12.0–46.0)
Lymphs Abs: 4.3 10*3/uL — ABNORMAL HIGH (ref 0.7–4.0)
MCHC: 32.8 g/dL (ref 30.0–36.0)
MCV: 86.7 fl (ref 78.0–100.0)
Monocytes Absolute: 0.6 10*3/uL (ref 0.1–1.0)
Monocytes Relative: 4.9 % (ref 3.0–12.0)
Neutro Abs: 7.7 10*3/uL (ref 1.4–7.7)
Neutrophils Relative %: 59.7 % (ref 43.0–77.0)
Platelets: 315 10*3/uL (ref 150.0–400.0)
RBC: 4.81 Mil/uL (ref 3.87–5.11)
RDW: 12.9 % (ref 11.5–15.5)
WBC: 12.9 10*3/uL — ABNORMAL HIGH (ref 4.0–10.5)

## 2021-01-09 LAB — LIPID PANEL
Cholesterol: 318 mg/dL — ABNORMAL HIGH (ref 0–200)
HDL: 52.1 mg/dL (ref >39.00)
Total CHOL/HDL Ratio: 6
Triglycerides: 609 mg/dL — ABNORMAL HIGH (ref 0.0–149.0)

## 2021-01-09 LAB — COMPREHENSIVE METABOLIC PANEL
ALT: 46 U/L — ABNORMAL HIGH (ref 0–35)
AST: 36 U/L (ref 0–37)
Albumin: 4.7 g/dL (ref 3.5–5.2)
Alkaline Phosphatase: 126 U/L — ABNORMAL HIGH (ref 39–117)
BUN: 18 mg/dL (ref 6–23)
CO2: 28 mEq/L (ref 19–32)
Calcium: 9.8 mg/dL (ref 8.4–10.5)
Chloride: 100 mEq/L (ref 96–112)
Creatinine, Ser: 0.73 mg/dL (ref 0.40–1.20)
GFR: 94.12 mL/min (ref >60.00)
Glucose, Bld: 106 mg/dL — ABNORMAL HIGH (ref 70–99)
Potassium: 4.1 mEq/L (ref 3.5–5.1)
Sodium: 138 mEq/L (ref 135–145)
Total Bilirubin: 0.3 mg/dL (ref 0.2–1.2)
Total Protein: 7.3 g/dL (ref 6.0–8.3)

## 2021-01-09 LAB — LIPASE: Lipase: 76 U/L — ABNORMAL HIGH (ref 11.0–59.0)

## 2021-01-09 LAB — LDL CHOLESTEROL, DIRECT: Direct LDL: 163 mg/dL

## 2021-01-09 NOTE — Telephone Encounter (Signed)
That should be fine  lets just do CT Abdo with contrast If they can do pancreatic protocol CT, they will be nice  RG

## 2021-01-09 NOTE — Telephone Encounter (Signed)
Dr. Lyndel Safe,  You ordered a CT abd/Pelvis and insurance is not wanting to approve the pelvis due to the diagnosis codes (pancreatitis/epigastric pain).  They will only approve the CT abdomen, will that be okay?   Please advice.  Thanks

## 2021-01-10 ENCOUNTER — Other Ambulatory Visit: Payer: Self-pay | Admitting: Gastroenterology

## 2021-01-10 DIAGNOSIS — R1013 Epigastric pain: Secondary | ICD-10-CM

## 2021-01-10 NOTE — Telephone Encounter (Signed)
Order has been changed to CT abdomen

## 2021-01-10 NOTE — Telephone Encounter (Signed)
Phyllis Rojas can you change this order to CT abd only please

## 2021-01-11 ENCOUNTER — Ambulatory Visit (HOSPITAL_BASED_OUTPATIENT_CLINIC_OR_DEPARTMENT_OTHER): Payer: BC Managed Care – PPO

## 2021-01-11 ENCOUNTER — Ambulatory Visit (HOSPITAL_BASED_OUTPATIENT_CLINIC_OR_DEPARTMENT_OTHER)
Admission: RE | Admit: 2021-01-11 | Discharge: 2021-01-11 | Disposition: A | Discharge status: Home / Self Care | Payer: BC Managed Care – PPO | Source: Ambulatory Visit | Attending: Gastroenterology | Admitting: Gastroenterology

## 2021-01-11 ENCOUNTER — Encounter (HOSPITAL_BASED_OUTPATIENT_CLINIC_OR_DEPARTMENT_OTHER): Payer: Self-pay

## 2021-01-11 ENCOUNTER — Other Ambulatory Visit: Payer: Self-pay

## 2021-01-11 DIAGNOSIS — K760 Fatty (change of) liver, not elsewhere classified: Secondary | ICD-10-CM | POA: Diagnosis not present

## 2021-01-11 DIAGNOSIS — R1013 Epigastric pain: Secondary | ICD-10-CM | POA: Insufficient documentation

## 2021-01-11 DIAGNOSIS — R110 Nausea: Secondary | ICD-10-CM | POA: Diagnosis not present

## 2021-01-11 MED ORDER — IOHEXOL 300 MG/ML  SOLN
100.0000 mL | Freq: Once | INTRAMUSCULAR | Status: AC | PRN
  Administered 2021-01-11: 100 mL via INTRAVENOUS

## 2021-01-11 NOTE — Progress Notes (Signed)
I have informed patient over the phone regarding CT results and discussed blood work in detail. CT Abdo only: Fatty liver, likely hemostatic clips/material in gallbladder fossa without any inflammation. Labs-elevated alk phos 126 (similar to before), ALT 46, lipase 76 (does not meet the criteria for biochemical pancreatitis, Not x3), WBC 12.6 (similar to baseline). TG 600 (was 955 before)  Plan: -Weight loss.  Aim is to lose 6lb over next 12 weeks. -Check acute hepatitis profile, ASMA, ANA, AMA, iron studies, CRP, hemoglobin A1c, celiac screen and GGT -Check HBsAb titer and HAV total Ab.  If neg, would recommend vaccination for A and B. -Can you please call Dr. Felipa Emory office-regarding starting TriCor if possible. -Please send her report of CT and labs -No EtOH for now -Low-fat diet -FU in 12 weeks  RG

## 2021-01-12 ENCOUNTER — Other Ambulatory Visit: Payer: Self-pay

## 2021-01-12 ENCOUNTER — Other Ambulatory Visit (INDEPENDENT_AMBULATORY_CARE_PROVIDER_SITE_OTHER): Payer: BC Managed Care – PPO

## 2021-01-12 DIAGNOSIS — R1013 Epigastric pain: Secondary | ICD-10-CM | POA: Diagnosis not present

## 2021-01-12 DIAGNOSIS — R748 Abnormal levels of other serum enzymes: Secondary | ICD-10-CM

## 2021-01-12 DIAGNOSIS — R52 Pain, unspecified: Secondary | ICD-10-CM

## 2021-01-12 LAB — HEMOGLOBIN A1C: Hgb A1c MFr Bld: 6.1 % (ref 4.6–6.5)

## 2021-01-12 LAB — C-REACTIVE PROTEIN: CRP: <1 mg/dL (ref 0.5–20.0)

## 2021-01-12 LAB — GAMMA GT: GGT: 85 U/L — ABNORMAL HIGH (ref 7–51)

## 2021-01-12 LAB — FERRITIN: Ferritin: 148 ng/mL (ref 10.0–291.0)

## 2021-01-15 ENCOUNTER — Other Ambulatory Visit: Payer: Self-pay

## 2021-01-15 MED ORDER — DICYCLOMINE HCL 10 MG PO CAPS: ORAL_CAPSULE | ORAL | 4 refills | Status: DC

## 2021-01-15 NOTE — Addendum Note (Signed)
Addended by: Karena Addison on: 01/15/2021 09:11 AM   Modules accepted: Orders

## 2021-01-16 LAB — CELIAC PANEL 10
Antigliadin Abs, IgA: 6 units (ref 0–19)
Endomysial IgA: NEGATIVE
Gliadin IgG: 2 units (ref 0–19)
IgA/Immunoglobulin A, Serum: 207 mg/dL (ref 87–352)
Tissue Transglut Ab: <2 U/mL (ref 0–5)
Transglutaminase IgA: <2 U/mL (ref 0–3)

## 2021-01-16 LAB — IRON AND TIBC
Iron Saturation: 22 % (ref 15–55)
Iron: 78 ug/dL (ref 27–159)
Total Iron Binding Capacity: 360 ug/dL (ref 250–450)
UIBC: 282 ug/dL (ref 131–425)

## 2021-01-17 LAB — HEPATITIS A ANTIBODY, TOTAL: Hepatitis A AB,Total: NONREACTIVE

## 2021-01-17 LAB — ANTI-SMOOTH MUSCLE ANTIBODY, IGG: Actin (Smooth Muscle) Antibody (IGG): <20 U (ref <20)

## 2021-01-17 LAB — MITOCHONDRIAL ANTIBODIES: Mitochondrial M2 Ab, IgG: <=20 U

## 2021-01-17 LAB — HEPATITIS PANEL, ACUTE
Hep A IgM: NONREACTIVE
Hep B C IgM: NONREACTIVE
Hepatitis B Surface Ag: NONREACTIVE
Hepatitis C Ab: NONREACTIVE
SIGNAL TO CUT-OFF: 0.18 (ref <1.00)

## 2021-01-17 LAB — HEPATITIS B SURFACE ANTIBODY,QUALITATIVE: Hep B S Ab: NONREACTIVE

## 2021-01-17 LAB — ANA: Anti Nuclear Antibody (ANA): NEGATIVE

## 2021-01-18 ENCOUNTER — Other Ambulatory Visit: Payer: Self-pay

## 2021-01-18 MED ORDER — DICYCLOMINE HCL 10 MG PO CAPS: ORAL_CAPSULE | ORAL | 4 refills | Status: AC

## 2021-01-18 MED ORDER — PANTOPRAZOLE SODIUM 20 MG PO TBEC: 20.0000 mg | DELAYED_RELEASE_TABLET | Freq: Two times a day (BID) | ORAL | 11 refills | Status: AC

## 2021-01-19 ENCOUNTER — Ambulatory Visit (INDEPENDENT_AMBULATORY_CARE_PROVIDER_SITE_OTHER): Payer: BC Managed Care – PPO | Admitting: Gastroenterology

## 2021-01-19 DIAGNOSIS — Z23 Encounter for immunization: Secondary | ICD-10-CM | POA: Diagnosis not present

## 2021-02-02 DIAGNOSIS — R7303 Prediabetes: Secondary | ICD-10-CM | POA: Diagnosis not present

## 2021-02-02 DIAGNOSIS — E782 Mixed hyperlipidemia: Secondary | ICD-10-CM | POA: Diagnosis not present

## 2021-02-02 DIAGNOSIS — K760 Fatty (change of) liver, not elsewhere classified: Secondary | ICD-10-CM | POA: Diagnosis not present

## 2021-02-05 NOTE — Progress Notes (Signed)
Just visit for vaccine

## 2021-02-16 ENCOUNTER — Ambulatory Visit (INDEPENDENT_AMBULATORY_CARE_PROVIDER_SITE_OTHER): Payer: BC Managed Care – PPO | Admitting: Gastroenterology

## 2021-02-16 DIAGNOSIS — Z23 Encounter for immunization: Secondary | ICD-10-CM

## 2021-03-06 DIAGNOSIS — K760 Fatty (change of) liver, not elsewhere classified: Secondary | ICD-10-CM | POA: Diagnosis not present

## 2021-03-06 DIAGNOSIS — R7303 Prediabetes: Secondary | ICD-10-CM | POA: Diagnosis not present

## 2021-03-06 DIAGNOSIS — E782 Mixed hyperlipidemia: Secondary | ICD-10-CM | POA: Diagnosis not present

## 2021-03-09 DIAGNOSIS — R7303 Prediabetes: Secondary | ICD-10-CM | POA: Diagnosis not present

## 2021-03-09 DIAGNOSIS — E782 Mixed hyperlipidemia: Secondary | ICD-10-CM | POA: Diagnosis not present

## 2021-03-09 DIAGNOSIS — K219 Gastro-esophageal reflux disease without esophagitis: Secondary | ICD-10-CM | POA: Diagnosis not present

## 2021-03-09 DIAGNOSIS — K760 Fatty (change of) liver, not elsewhere classified: Secondary | ICD-10-CM | POA: Diagnosis not present

## 2021-05-22 IMAGING — CT CT ABDOMEN W/ CM
2 of 5 series · 16 of 46 positions shown, 18 images · IV contrast (Omnipaque)
Comparison: CT abdomen pelvis June 05, 2015

CLINICAL DATA: Right-sided abdominal pain and nausea since [REDACTED]

EXAM:
CT ABDOMEN WITH CONTRAST
TECHNIQUE: Multidetector CT imaging of the abdomen was performed using the
standard protocol following bolus administration of intravenous
contrast.
CONTRAST:  100mL OMNIPAQUE IOHEXOL 300 MG/ML  SOLN

[Series 2: axial st · axial · 0.88mm/px · z∈[-278,-38]mm · 13 of 56 slices shown, 15 images]
[im 4/56  soft-tissue]
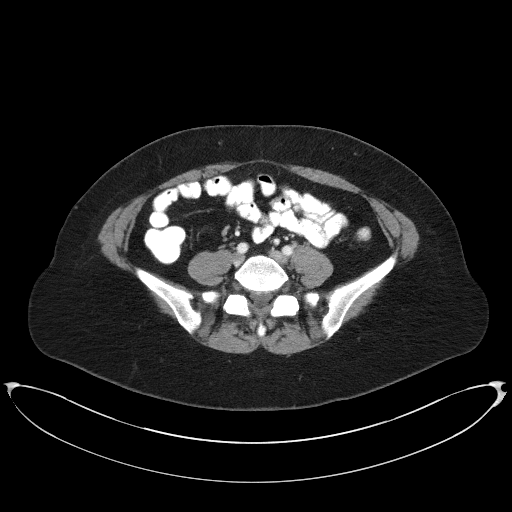
[im 4/56  bone]
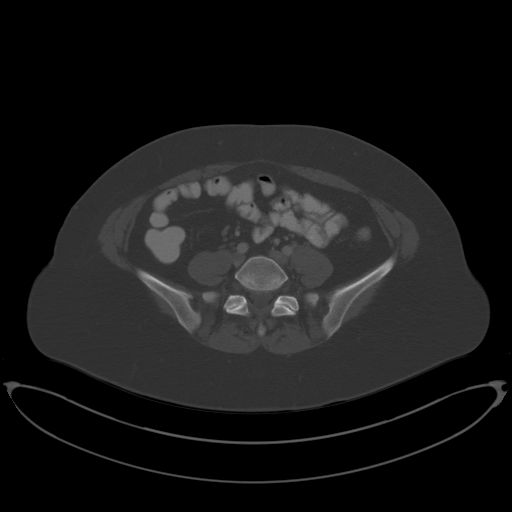
[im 8/56  soft-tissue]
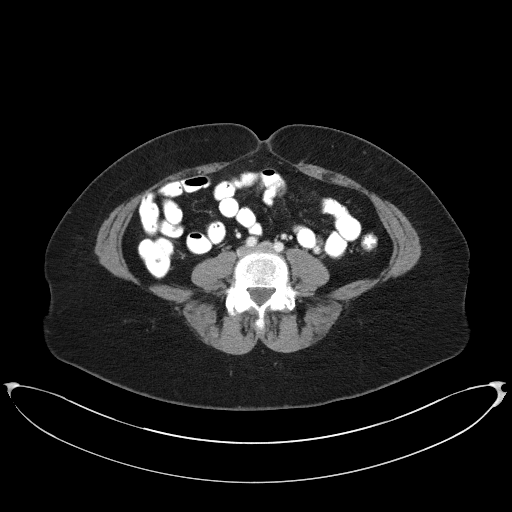
[im 12/56  soft-tissue]
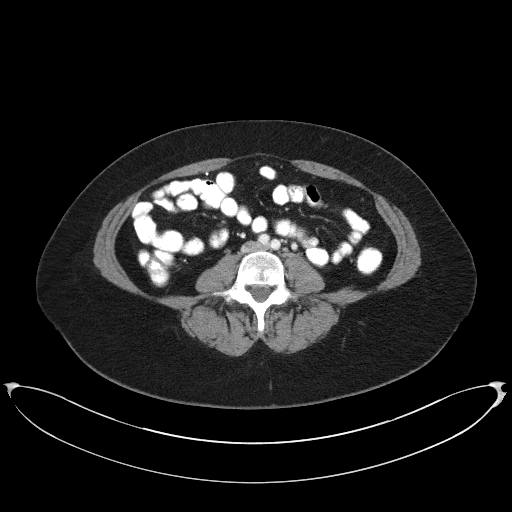
[im 15/56  soft-tissue]
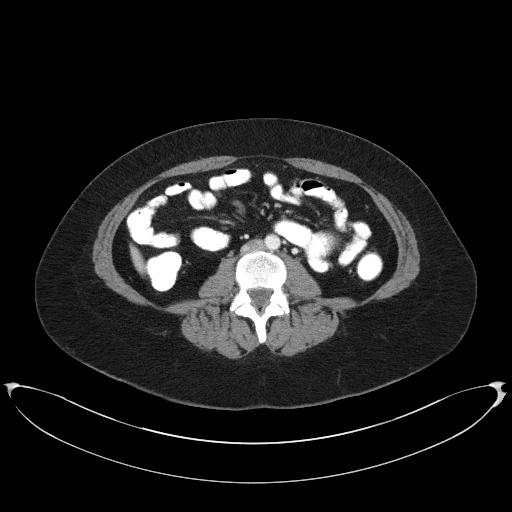
[im 19/56  soft-tissue]
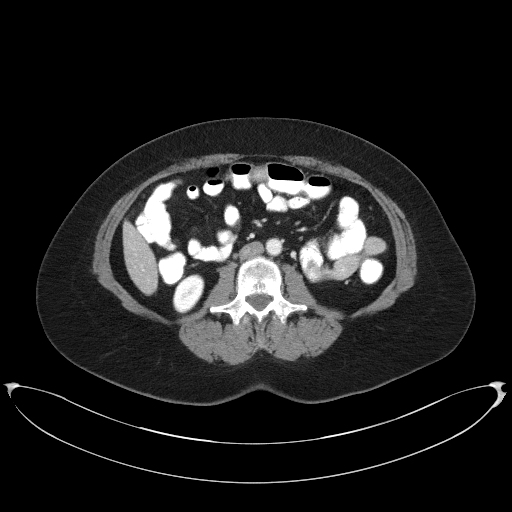
[im 23/56  soft-tissue]
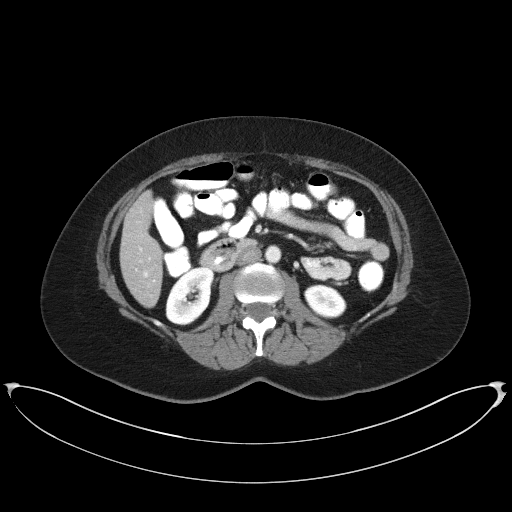
[im 30/56  soft-tissue]
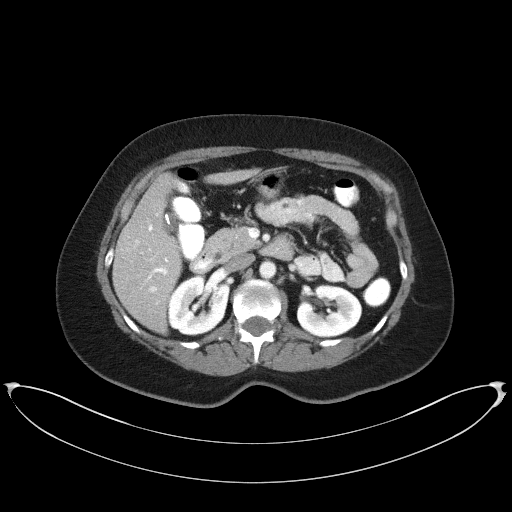
[im 34/56  soft-tissue]
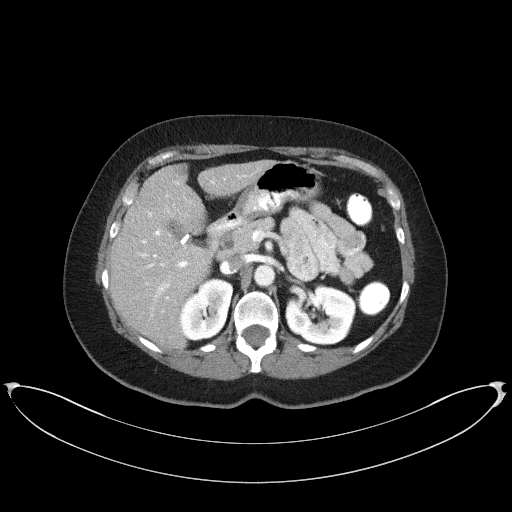
[im 37/56  soft-tissue]
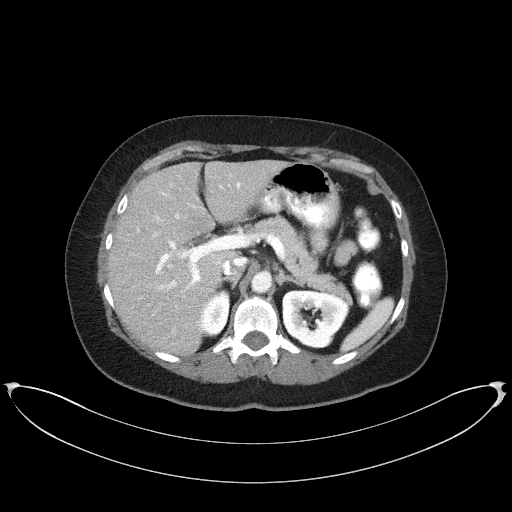
[im 37/56  bone]
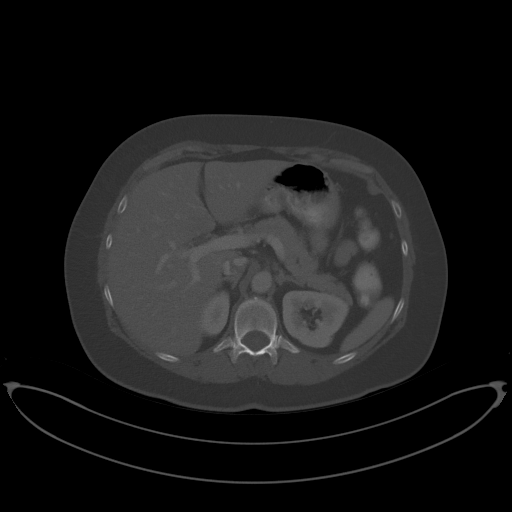
[im 41/56  soft-tissue]
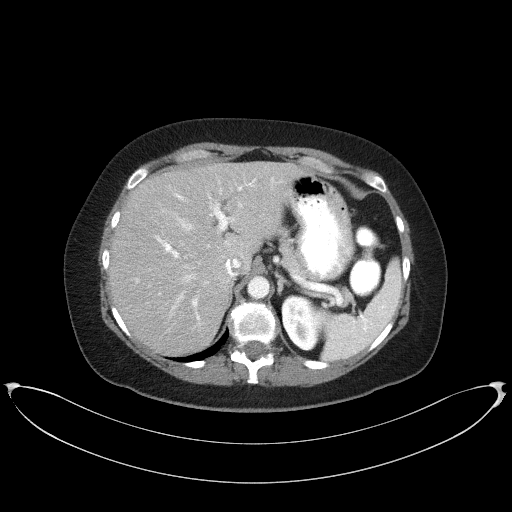
[im 45/56  soft-tissue]
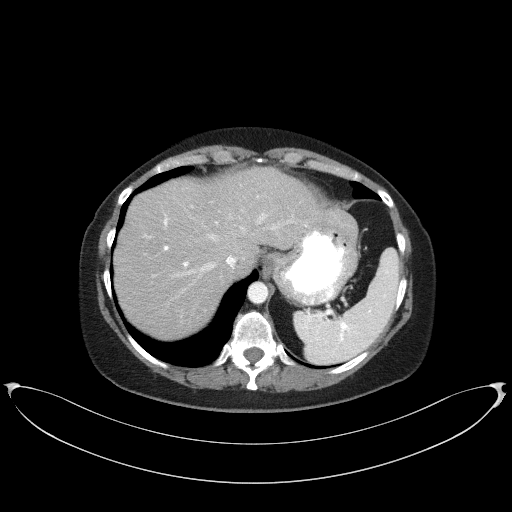
[im 48/56  soft-tissue]
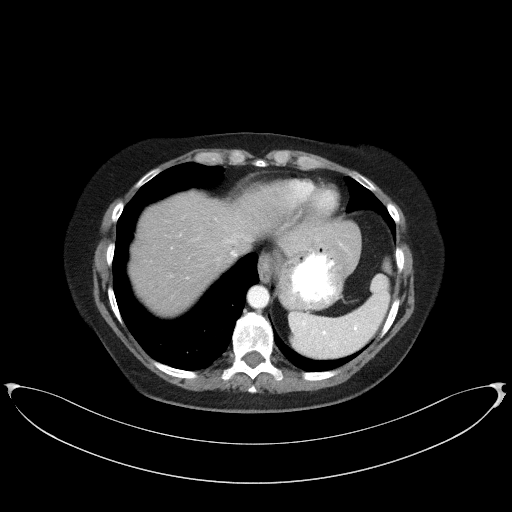
[im 52/56  soft-tissue]
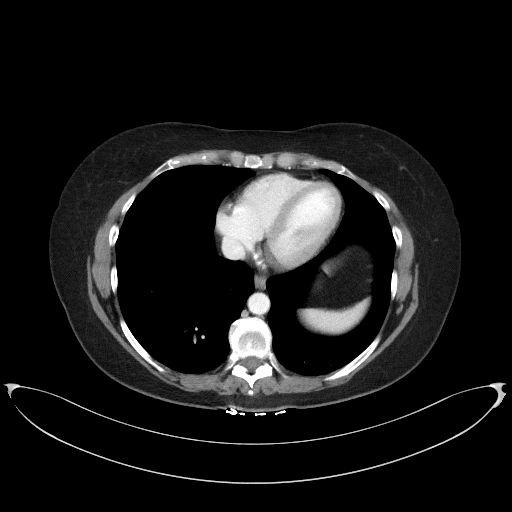

[Series 5: coronal st · coronal · 0.59mm/px · 3 of 89 slices shown]
[im 30/89  soft-tissue]
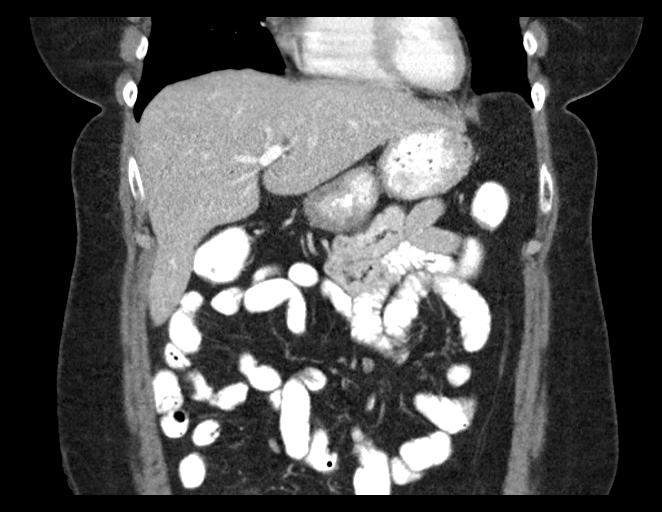
[im 40/89  soft-tissue]
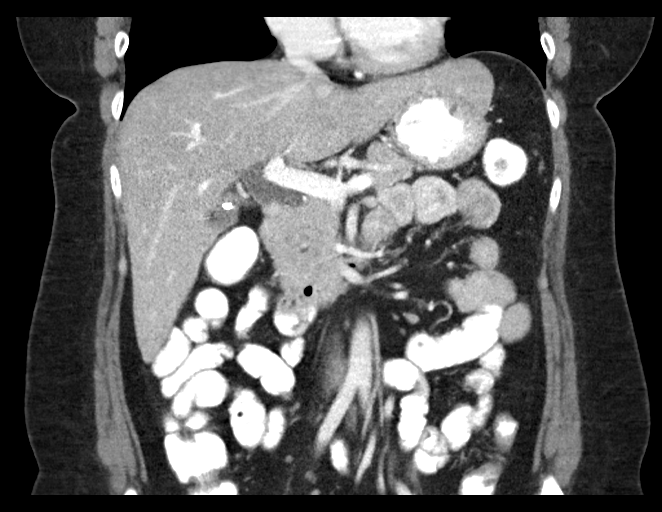
[im 49/89  soft-tissue]
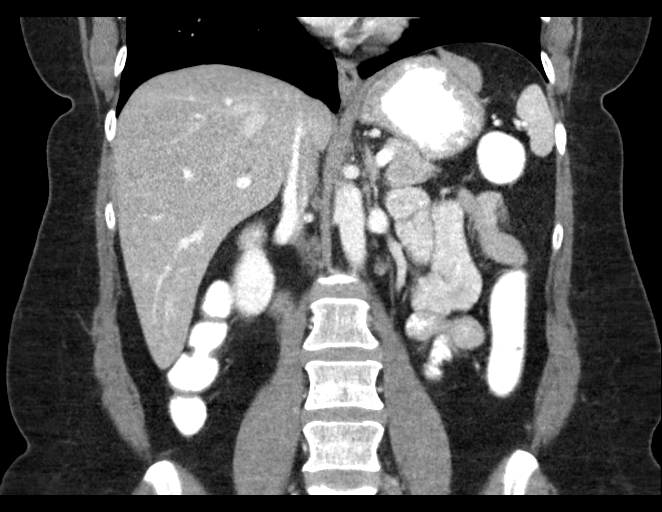

[16 of 46 positions shown; findings below may reference images not displayed]

FINDINGS: Lower chest: No acute abnormality.

Hepatobiliary: Diffuse hepatic steatosis. Cholecystectomy, with
decreased volume of low-density material within the cholecystectomy
bed which may represent surgical hemostatic material. Mild
prominence of the biliary tree, likely reservoir effect status post
cholecystectomy.

Pancreas: Unremarkable. No pancreatic ductal dilatation or
surrounding inflammatory changes.

Spleen: Normal in size without focal abnormality.

Adrenals/Urinary Tract: Adrenal glands are unremarkable. Kidneys are
normal, without renal calculi, focal lesion, or hydronephrosis.

Stomach/Bowel: Stomach and small bowel are within normal limits. No
evidence of bowel wall thickening, distention, or inflammatory
changes.

Vascular/Lymphatic: No significant vascular findings are present. No
enlarged abdominal or pelvic lymph nodes.

Other: No abdominal wall hernia or abnormality. No abdominopelvic
ascites.

Musculoskeletal: No acute or significant osseous findings.
IMPRESSION: 1. Diffuse hepatic steatosis, which can be a cause of abdominal
pain.
2. Cholecystectomy, with decreased volume of low-density material
within the cholecystectomy bed which may represent previously placed
surgical hemostatic material. Correlation with surgical procedure
note recommended.

## 2021-06-04 DIAGNOSIS — E782 Mixed hyperlipidemia: Secondary | ICD-10-CM | POA: Diagnosis not present

## 2021-06-04 DIAGNOSIS — K760 Fatty (change of) liver, not elsewhere classified: Secondary | ICD-10-CM | POA: Diagnosis not present

## 2021-06-04 DIAGNOSIS — Z79899 Other long term (current) drug therapy: Secondary | ICD-10-CM | POA: Diagnosis not present

## 2021-06-04 DIAGNOSIS — R7303 Prediabetes: Secondary | ICD-10-CM | POA: Diagnosis not present

## 2021-06-07 DIAGNOSIS — K589 Irritable bowel syndrome without diarrhea: Secondary | ICD-10-CM | POA: Diagnosis not present

## 2021-06-07 DIAGNOSIS — R7303 Prediabetes: Secondary | ICD-10-CM | POA: Diagnosis not present

## 2021-06-07 DIAGNOSIS — E782 Mixed hyperlipidemia: Secondary | ICD-10-CM | POA: Diagnosis not present

## 2021-06-07 DIAGNOSIS — K219 Gastro-esophageal reflux disease without esophagitis: Secondary | ICD-10-CM | POA: Diagnosis not present

## 2021-07-09 DIAGNOSIS — Z91018 Allergy to other foods: Secondary | ICD-10-CM | POA: Diagnosis not present

## 2021-07-20 ENCOUNTER — Ambulatory Visit (INDEPENDENT_AMBULATORY_CARE_PROVIDER_SITE_OTHER): Payer: BC Managed Care – PPO | Admitting: Gastroenterology

## 2021-07-20 ENCOUNTER — Other Ambulatory Visit: Payer: Self-pay

## 2021-07-20 DIAGNOSIS — Z23 Encounter for immunization: Secondary | ICD-10-CM | POA: Diagnosis not present

## 2021-07-27 DIAGNOSIS — E782 Mixed hyperlipidemia: Secondary | ICD-10-CM | POA: Diagnosis not present

## 2021-07-27 DIAGNOSIS — M549 Dorsalgia, unspecified: Secondary | ICD-10-CM | POA: Diagnosis not present

## 2021-07-27 DIAGNOSIS — R7303 Prediabetes: Secondary | ICD-10-CM | POA: Diagnosis not present

## 2021-07-27 DIAGNOSIS — R197 Diarrhea, unspecified: Secondary | ICD-10-CM | POA: Diagnosis not present

## 2022-06-20 DIAGNOSIS — E119 Type 2 diabetes mellitus without complications: Secondary | ICD-10-CM | POA: Diagnosis not present

## 2022-07-01 DIAGNOSIS — L578 Other skin changes due to chronic exposure to nonionizing radiation: Secondary | ICD-10-CM | POA: Diagnosis not present

## 2022-07-01 DIAGNOSIS — D2239 Melanocytic nevi of other parts of face: Secondary | ICD-10-CM | POA: Diagnosis not present

## 2022-07-01 DIAGNOSIS — D225 Melanocytic nevi of trunk: Secondary | ICD-10-CM | POA: Diagnosis not present

## 2022-07-03 DIAGNOSIS — R197 Diarrhea, unspecified: Secondary | ICD-10-CM | POA: Diagnosis not present

## 2022-07-03 DIAGNOSIS — R079 Chest pain, unspecified: Secondary | ICD-10-CM | POA: Diagnosis not present

## 2022-07-03 DIAGNOSIS — R109 Unspecified abdominal pain: Secondary | ICD-10-CM | POA: Diagnosis not present

## 2022-07-03 DIAGNOSIS — K529 Noninfective gastroenteritis and colitis, unspecified: Secondary | ICD-10-CM | POA: Diagnosis not present

## 2022-07-11 DIAGNOSIS — Z6828 Body mass index (BMI) 28.0-28.9, adult: Secondary | ICD-10-CM | POA: Diagnosis not present

## 2022-07-11 DIAGNOSIS — K529 Noninfective gastroenteritis and colitis, unspecified: Secondary | ICD-10-CM | POA: Diagnosis not present

## 2022-08-27 DIAGNOSIS — Z203 Contact with and (suspected) exposure to rabies: Secondary | ICD-10-CM | POA: Diagnosis not present

## 2022-08-27 DIAGNOSIS — Z2914 Encounter for prophylactic rabies immune globin: Secondary | ICD-10-CM | POA: Diagnosis not present

## 2022-08-27 DIAGNOSIS — M79642 Pain in left hand: Secondary | ICD-10-CM | POA: Diagnosis not present

## 2022-08-27 DIAGNOSIS — S60572A Other superficial bite of hand of left hand, initial encounter: Secondary | ICD-10-CM | POA: Diagnosis not present

## 2022-08-27 DIAGNOSIS — S61452A Open bite of left hand, initial encounter: Secondary | ICD-10-CM | POA: Diagnosis not present

## 2022-08-27 DIAGNOSIS — W540XXA Bitten by dog, initial encounter: Secondary | ICD-10-CM | POA: Diagnosis not present

## 2022-08-27 DIAGNOSIS — Z23 Encounter for immunization: Secondary | ICD-10-CM | POA: Diagnosis not present

## 2022-08-30 DIAGNOSIS — Z2914 Encounter for prophylactic rabies immune globin: Secondary | ICD-10-CM | POA: Diagnosis not present

## 2022-08-30 DIAGNOSIS — Z203 Contact with and (suspected) exposure to rabies: Secondary | ICD-10-CM | POA: Diagnosis not present

## 2022-08-30 DIAGNOSIS — Z23 Encounter for immunization: Secondary | ICD-10-CM | POA: Diagnosis not present

## 2022-09-03 DIAGNOSIS — Z2914 Encounter for prophylactic rabies immune globin: Secondary | ICD-10-CM | POA: Diagnosis not present

## 2022-09-03 DIAGNOSIS — Z203 Contact with and (suspected) exposure to rabies: Secondary | ICD-10-CM | POA: Diagnosis not present

## 2022-09-03 DIAGNOSIS — Z23 Encounter for immunization: Secondary | ICD-10-CM | POA: Diagnosis not present

## 2022-09-10 DIAGNOSIS — Z2914 Encounter for prophylactic rabies immune globin: Secondary | ICD-10-CM | POA: Diagnosis not present

## 2022-09-10 DIAGNOSIS — Z203 Contact with and (suspected) exposure to rabies: Secondary | ICD-10-CM | POA: Diagnosis not present

## 2022-09-10 DIAGNOSIS — Z23 Encounter for immunization: Secondary | ICD-10-CM | POA: Diagnosis not present

## 2022-10-09 DIAGNOSIS — E1169 Type 2 diabetes mellitus with other specified complication: Secondary | ICD-10-CM | POA: Diagnosis not present

## 2022-10-09 DIAGNOSIS — Z6826 Body mass index (BMI) 26.0-26.9, adult: Secondary | ICD-10-CM | POA: Diagnosis not present

## 2022-10-09 DIAGNOSIS — E782 Mixed hyperlipidemia: Secondary | ICD-10-CM | POA: Diagnosis not present

## 2022-10-09 DIAGNOSIS — K219 Gastro-esophageal reflux disease without esophagitis: Secondary | ICD-10-CM | POA: Diagnosis not present

## 2022-12-05 DIAGNOSIS — E1169 Type 2 diabetes mellitus with other specified complication: Secondary | ICD-10-CM | POA: Diagnosis not present

## 2022-12-05 DIAGNOSIS — K589 Irritable bowel syndrome without diarrhea: Secondary | ICD-10-CM | POA: Diagnosis not present

## 2022-12-05 DIAGNOSIS — Z1231 Encounter for screening mammogram for malignant neoplasm of breast: Secondary | ICD-10-CM | POA: Diagnosis not present

## 2022-12-05 DIAGNOSIS — K219 Gastro-esophageal reflux disease without esophagitis: Secondary | ICD-10-CM | POA: Diagnosis not present

## 2022-12-05 DIAGNOSIS — Z6825 Body mass index (BMI) 25.0-25.9, adult: Secondary | ICD-10-CM | POA: Diagnosis not present

## 2022-12-17 DIAGNOSIS — E1169 Type 2 diabetes mellitus with other specified complication: Secondary | ICD-10-CM | POA: Diagnosis not present

## 2022-12-17 DIAGNOSIS — Z79899 Other long term (current) drug therapy: Secondary | ICD-10-CM | POA: Diagnosis not present

## 2022-12-17 DIAGNOSIS — E782 Mixed hyperlipidemia: Secondary | ICD-10-CM | POA: Diagnosis not present

## 2023-06-09 DIAGNOSIS — E1169 Type 2 diabetes mellitus with other specified complication: Secondary | ICD-10-CM | POA: Diagnosis not present

## 2023-06-09 DIAGNOSIS — Z79899 Other long term (current) drug therapy: Secondary | ICD-10-CM | POA: Diagnosis not present

## 2023-06-09 DIAGNOSIS — D2362 Other benign neoplasm of skin of left upper limb, including shoulder: Secondary | ICD-10-CM | POA: Diagnosis not present

## 2023-06-09 DIAGNOSIS — Z6824 Body mass index (BMI) 24.0-24.9, adult: Secondary | ICD-10-CM | POA: Diagnosis not present

## 2023-06-09 DIAGNOSIS — L578 Other skin changes due to chronic exposure to nonionizing radiation: Secondary | ICD-10-CM | POA: Diagnosis not present

## 2023-06-09 DIAGNOSIS — D225 Melanocytic nevi of trunk: Secondary | ICD-10-CM | POA: Diagnosis not present

## 2023-06-09 DIAGNOSIS — K589 Irritable bowel syndrome without diarrhea: Secondary | ICD-10-CM | POA: Diagnosis not present

## 2023-06-09 DIAGNOSIS — E782 Mixed hyperlipidemia: Secondary | ICD-10-CM | POA: Diagnosis not present

## 2023-07-03 DIAGNOSIS — E119 Type 2 diabetes mellitus without complications: Secondary | ICD-10-CM | POA: Diagnosis not present

## 2023-10-21 DIAGNOSIS — E538 Deficiency of other specified B group vitamins: Secondary | ICD-10-CM | POA: Diagnosis not present

## 2023-10-21 DIAGNOSIS — D72829 Elevated white blood cell count, unspecified: Secondary | ICD-10-CM | POA: Diagnosis not present

## 2024-05-31 DIAGNOSIS — R7303 Prediabetes: Secondary | ICD-10-CM | POA: Diagnosis not present

## 2024-05-31 DIAGNOSIS — E1169 Type 2 diabetes mellitus with other specified complication: Secondary | ICD-10-CM | POA: Diagnosis not present

## 2024-05-31 DIAGNOSIS — Z1331 Encounter for screening for depression: Secondary | ICD-10-CM | POA: Diagnosis not present

## 2024-05-31 DIAGNOSIS — Z1231 Encounter for screening mammogram for malignant neoplasm of breast: Secondary | ICD-10-CM | POA: Diagnosis not present

## 2024-05-31 DIAGNOSIS — E782 Mixed hyperlipidemia: Secondary | ICD-10-CM | POA: Diagnosis not present

## 2024-05-31 DIAGNOSIS — Z0000 Encounter for general adult medical examination without abnormal findings: Secondary | ICD-10-CM | POA: Diagnosis not present

## 2024-05-31 DIAGNOSIS — Z124 Encounter for screening for malignant neoplasm of cervix: Secondary | ICD-10-CM | POA: Diagnosis not present

## 2024-06-08 DIAGNOSIS — D485 Neoplasm of uncertain behavior of skin: Secondary | ICD-10-CM | POA: Diagnosis not present

## 2024-06-08 DIAGNOSIS — B351 Tinea unguium: Secondary | ICD-10-CM | POA: Diagnosis not present

## 2024-06-08 DIAGNOSIS — B355 Tinea imbricata: Secondary | ICD-10-CM | POA: Diagnosis not present

## 2024-06-08 DIAGNOSIS — D225 Melanocytic nevi of trunk: Secondary | ICD-10-CM | POA: Diagnosis not present
